# Patient Record
Sex: Female | Born: 1965 | Race: White | Hispanic: No | Marital: Married
Health system: Southern US, Community
[De-identification: ages and names within clinical notes are randomized; demographics above are authoritative.]

---

## 1998-12-04 ENCOUNTER — Encounter (INDEPENDENT_AMBULATORY_CARE_PROVIDER_SITE_OTHER): Payer: Self-pay | Admitting: Specialist

## 1998-12-04 ENCOUNTER — Other Ambulatory Visit: Admission: RE | Admit: 1998-12-04 | Discharge: 1998-12-04 | Payer: Self-pay | Admitting: Gastroenterology

## 1999-03-15 ENCOUNTER — Other Ambulatory Visit: Admission: RE | Admit: 1999-03-15 | Discharge: 1999-03-15 | Payer: Self-pay | Admitting: Obstetrics and Gynecology

## 2000-04-23 ENCOUNTER — Other Ambulatory Visit: Admission: RE | Admit: 2000-04-23 | Discharge: 2000-04-23 | Payer: Self-pay | Admitting: Obstetrics and Gynecology

## 2000-11-13 ENCOUNTER — Observation Stay (HOSPITAL_COMMUNITY): Admission: AD | Admit: 2000-11-13 | Discharge: 2000-11-14 | Payer: Self-pay | Admitting: Obstetrics and Gynecology

## 2000-11-16 ENCOUNTER — Inpatient Hospital Stay (HOSPITAL_COMMUNITY): Admission: AD | Admit: 2000-11-16 | Discharge: 2000-11-16 | Payer: Self-pay | Admitting: Obstetrics & Gynecology

## 2000-11-17 ENCOUNTER — Inpatient Hospital Stay (HOSPITAL_COMMUNITY): Admission: AD | Admit: 2000-11-17 | Discharge: 2000-11-20 | Payer: Self-pay | Admitting: Obstetrics and Gynecology

## 2000-11-19 ENCOUNTER — Encounter: Payer: Self-pay | Admitting: Obstetrics and Gynecology

## 2000-11-21 ENCOUNTER — Encounter: Admission: RE | Admit: 2000-11-21 | Discharge: 2000-12-21 | Payer: Self-pay | Admitting: Obstetrics and Gynecology

## 2000-12-25 ENCOUNTER — Other Ambulatory Visit: Admission: RE | Admit: 2000-12-25 | Discharge: 2000-12-25 | Payer: Self-pay | Admitting: Obstetrics and Gynecology

## 2001-01-21 ENCOUNTER — Encounter: Admission: RE | Admit: 2001-01-21 | Discharge: 2001-02-20 | Payer: Self-pay | Admitting: Obstetrics and Gynecology

## 2001-02-21 ENCOUNTER — Encounter: Admission: RE | Admit: 2001-02-21 | Discharge: 2001-03-23 | Payer: Self-pay | Admitting: Obstetrics and Gynecology

## 2001-04-23 ENCOUNTER — Encounter: Admission: RE | Admit: 2001-04-23 | Discharge: 2001-05-23 | Payer: Self-pay | Admitting: Obstetrics and Gynecology

## 2001-06-23 ENCOUNTER — Encounter: Admission: RE | Admit: 2001-06-23 | Discharge: 2001-07-23 | Payer: Self-pay | Admitting: Obstetrics and Gynecology

## 2001-11-25 ENCOUNTER — Encounter: Admission: RE | Admit: 2001-11-25 | Discharge: 2001-11-25 | Payer: Self-pay | Admitting: Obstetrics and Gynecology

## 2001-11-25 ENCOUNTER — Encounter: Payer: Self-pay | Admitting: Obstetrics and Gynecology

## 2002-05-25 ENCOUNTER — Other Ambulatory Visit: Admission: RE | Admit: 2002-05-25 | Discharge: 2002-05-25 | Payer: Self-pay | Admitting: Obstetrics and Gynecology

## 2002-06-25 ENCOUNTER — Other Ambulatory Visit: Admission: RE | Admit: 2002-06-25 | Discharge: 2002-06-25 | Payer: Self-pay | Admitting: Obstetrics and Gynecology

## 2003-08-17 ENCOUNTER — Other Ambulatory Visit: Admission: RE | Admit: 2003-08-17 | Discharge: 2003-08-17 | Payer: Self-pay | Admitting: Obstetrics and Gynecology

## 2004-10-19 ENCOUNTER — Encounter: Admission: RE | Admit: 2004-10-19 | Discharge: 2004-10-19 | Payer: Self-pay | Admitting: Obstetrics and Gynecology

## 2005-12-05 ENCOUNTER — Encounter: Admission: RE | Admit: 2005-12-05 | Discharge: 2005-12-05 | Payer: Self-pay | Admitting: Obstetrics and Gynecology

## 2006-05-20 ENCOUNTER — Emergency Department (HOSPITAL_COMMUNITY): Admission: EM | Admit: 2006-05-20 | Discharge: 2006-05-21 | Payer: Self-pay | Admitting: Emergency Medicine

## 2006-12-24 ENCOUNTER — Encounter: Admission: RE | Admit: 2006-12-24 | Discharge: 2006-12-24 | Payer: Self-pay | Admitting: Obstetrics and Gynecology

## 2007-12-28 ENCOUNTER — Encounter: Admission: RE | Admit: 2007-12-28 | Discharge: 2007-12-28 | Payer: Self-pay | Admitting: Obstetrics and Gynecology

## 2008-09-26 ENCOUNTER — Encounter (INDEPENDENT_AMBULATORY_CARE_PROVIDER_SITE_OTHER): Payer: Self-pay | Admitting: *Deleted

## 2008-12-30 ENCOUNTER — Encounter: Admission: RE | Admit: 2008-12-30 | Discharge: 2008-12-30 | Payer: Self-pay | Admitting: Obstetrics and Gynecology

## 2010-10-26 NOTE — H&P (Signed)
[facility]  Patient:    Judy Reyes, Judy Reyes                     MRN: [id number] Adm. Date:  [id number] Attending:  Lenoard Aden                         History and Physical  CHIEF COMPLAINT:              Rule out preeclampsia.  HISTORY OF PRESENT ILLNESS:   The patient is a 45 year old white female, G1 P0, EDD of December 03, 2000 at ______ 37 weeks, who presents with probable new onset preeclampsia.  PAST MEDICAL HISTORY:         Remarkable for HSV and vulvodynia, and recurrent yeast infections.  MEDICATIONS:                  Include Valtrex, prenatal vitamins and Labetalol.  SOCIAL HISTORY:               Otherwise noncontributory.  FAMILY HISTORY:               Hypertension, heart disease and thyroid dysfunction.  She has no known drug allergies.  PRENATAL LABORATORY DATA:     Reveal a blood type of O-positive, rH antibody negative, rubella nonimmune, hepatitis B surface antigen negative, and group B strep was negative.  Prenatal care was complicated by a probable chronic hypertension requiring medications in the third trimester.  Has been well-controlled on the Labetalol 100 mg b.i.d. with normal laboratory work until recently when there was a mild elevation of her SGOT and SGPT for which she was hospitalized four days ago with stabilization of her laboratory work and discharged to home.  In the office today, she reported an increased headache, epigastric pain and elevations of blood pressure.  PHYSICAL EXAMINATION:  VITAL SIGNS:                  Blood pressure 160/100.  HEENT:                        Normal.  LUNGS:                        Clear.  HEART:                        Regular rhythm.  ABDOMEN:                      Soft, gravid, nontender.  Estimated fetal weight 6.5 pounds.  CERVIX:                       Two cm, 50%, vertex, -2.  EXTREMITIES:                  ______ DTRs of 3+, no evidence of clonus.  NEUROLOGIC  EXAMINATION:       Nonfocal.  IMPRESSION:                   1. A 37-week intrauterine pregnancy.                               2. Chronic hypertension with superimposed  preeclampsia.                               3. History of HSV without lesions.  PLAN:                         Proceed with magnesium prophylaxis, Pitocin, anticipated attempts at vaginal delivery. DD:  11/17/00 TD:  11/17/00 Job: [id number]

## 2010-11-21 ENCOUNTER — Other Ambulatory Visit: Payer: Self-pay | Admitting: Obstetrics and Gynecology

## 2010-11-21 DIAGNOSIS — Z1231 Encounter for screening mammogram for malignant neoplasm of breast: Secondary | ICD-10-CM

## 2010-11-28 ENCOUNTER — Ambulatory Visit
Admission: RE | Admit: 2010-11-28 | Discharge: 2010-11-28 | Disposition: A | Discharge status: Home / Self Care | Payer: BC Managed Care – PPO | Source: Ambulatory Visit | Attending: Obstetrics and Gynecology | Admitting: Obstetrics and Gynecology

## 2010-11-28 DIAGNOSIS — Z1231 Encounter for screening mammogram for malignant neoplasm of breast: Secondary | ICD-10-CM

## 2011-01-24 ENCOUNTER — Other Ambulatory Visit: Payer: Self-pay | Admitting: Obstetrics and Gynecology

## 2011-01-24 DIAGNOSIS — Z780 Asymptomatic menopausal state: Secondary | ICD-10-CM

## 2011-01-24 DIAGNOSIS — M8580 Other specified disorders of bone density and structure, unspecified site: Secondary | ICD-10-CM

## 2011-08-26 ENCOUNTER — Other Ambulatory Visit: Payer: Self-pay | Admitting: Gastroenterology

## 2011-12-23 ENCOUNTER — Other Ambulatory Visit: Payer: Self-pay | Admitting: Obstetrics and Gynecology

## 2011-12-23 DIAGNOSIS — Z1231 Encounter for screening mammogram for malignant neoplasm of breast: Secondary | ICD-10-CM

## 2012-01-10 ENCOUNTER — Ambulatory Visit: Payer: BC Managed Care – PPO

## 2012-01-10 ENCOUNTER — Other Ambulatory Visit: Payer: BC Managed Care – PPO

## 2012-01-24 ENCOUNTER — Ambulatory Visit
Admission: RE | Admit: 2012-01-24 | Discharge: 2012-01-24 | Disposition: A | Discharge status: Home / Self Care | Payer: BC Managed Care – PPO | Source: Ambulatory Visit | Attending: Obstetrics and Gynecology | Admitting: Obstetrics and Gynecology

## 2012-01-24 DIAGNOSIS — Z780 Asymptomatic menopausal state: Secondary | ICD-10-CM

## 2012-01-24 DIAGNOSIS — Z1231 Encounter for screening mammogram for malignant neoplasm of breast: Secondary | ICD-10-CM

## 2013-01-18 ENCOUNTER — Other Ambulatory Visit: Payer: Self-pay

## 2013-01-18 DIAGNOSIS — Z1231 Encounter for screening mammogram for malignant neoplasm of breast: Secondary | ICD-10-CM

## 2013-01-29 ENCOUNTER — Ambulatory Visit
Admission: RE | Admit: 2013-01-29 | Discharge: 2013-01-29 | Disposition: A | Discharge status: Home / Self Care | Payer: BC Managed Care – PPO | Source: Ambulatory Visit

## 2013-01-29 DIAGNOSIS — Z1231 Encounter for screening mammogram for malignant neoplasm of breast: Secondary | ICD-10-CM

## 2014-03-09 ENCOUNTER — Encounter: Payer: Self-pay | Admitting: Gastroenterology

## 2014-04-19 ENCOUNTER — Ambulatory Visit (HOSPITAL_BASED_OUTPATIENT_CLINIC_OR_DEPARTMENT_OTHER): Payer: BC Managed Care – PPO | Attending: Internal Medicine | Admitting: Radiology

## 2014-04-19 VITALS — Ht 64.0 in | Wt 170.0 lb

## 2014-04-19 DIAGNOSIS — G4730 Sleep apnea, unspecified: Secondary | ICD-10-CM | POA: Diagnosis present

## 2014-04-19 DIAGNOSIS — G4733 Obstructive sleep apnea (adult) (pediatric): Secondary | ICD-10-CM | POA: Insufficient documentation

## 2014-04-23 DIAGNOSIS — G4733 Obstructive sleep apnea (adult) (pediatric): Secondary | ICD-10-CM

## 2014-04-23 NOTE — Sleep Study (Signed)
   NAME: Judy MerrittsMarina A Reyes DATE OF BIRTH:  [birth year] MEDICAL RECORD NUMBER [id number]  LOCATION: [facility]  PHYSICIAN: YOUNG,CLINTON D  DATE OF STUDY: 04/19/2014  SLEEP STUDY TYPE: Nocturnal Polysomnogram               REFERRING PHYSICIAN: Keturah Barrerossley, James J, MD  INDICATION FOR STUDY: hypersomnia with sleep apnea  EPWORTH SLEEPINESS SCORE:   11/24 HEIGHT: 5\' 4"  (162.6 cm)  WEIGHT: 170 lb (77.111 kg)    Body mass index is 29.17 kg/(m^2).  NECK SIZE: 12.5 in.  MEDICATIONS: Charted for review  SLEEP ARCHITECTURE: total sleep time 380.5 minutes with sleep efficiency 89.7%. Stage I was 9.1%, stage II 75%, stage III 7.5%, REM 8.4% of total sleep time. Sleep latency 4 minutes, REM latency 256.5 minutes, awake after sleep onset 39.5 minutes, arousal index 31.4, bedtime medication: None  RESPIRATORY DATA: apnea hypopnea index (AHI) 7.6 per hour. 48 total events scored including 13 obstructive apneas, 2 central apneas, 33 hypopneas. Most events were with supine sleep position. REM AHI 24.4 per hour. This was a diagnostic polysomnogram as ordered, without CPAP.  OXYGEN DATA: moderately loud snoring with oxygen desaturation to a nadir of 85% and mean saturation 95.6% on room air.  CARDIAC DATA: sinus rhythm with PVCs  MOVEMENT/PARASOMNIA: no significant movement disturbance, no bathroom trips  IMPRESSION/ RECOMMENDATION:   1) Mild obstructive sleep apnea/hypopnea syndrome, AHI 7.6 per hour with mostly supine events. REM AHI 24.4 per hour. Moderately loud snoring with oxygen desaturation to a nadir of 85% and mean saturation 95.6% on room air.   Waymon BudgeYOUNG,CLINTON D Diplomate, American Board of Sleep Medicine  ELECTRONICALLY SIGNED ON:  04/23/2014, 1:50 PM [facility] PH: [phone]   FX: [phone] ACCREDITED BY THE AMERICAN ACADEMY OF SLEEP MEDICINE

## 2014-07-08 ENCOUNTER — Other Ambulatory Visit: Payer: Self-pay

## 2014-07-08 ENCOUNTER — Ambulatory Visit
Admission: RE | Admit: 2014-07-08 | Discharge: 2014-07-08 | Disposition: A | Discharge status: Home / Self Care | Payer: BC Managed Care – PPO | Source: Ambulatory Visit

## 2014-07-08 DIAGNOSIS — Z1231 Encounter for screening mammogram for malignant neoplasm of breast: Secondary | ICD-10-CM

## 2014-10-19 ENCOUNTER — Encounter: Payer: Self-pay | Admitting: Gastroenterology

## 2016-01-03 ENCOUNTER — Other Ambulatory Visit: Payer: Self-pay | Admitting: Obstetrics and Gynecology

## 2016-01-03 DIAGNOSIS — Z1231 Encounter for screening mammogram for malignant neoplasm of breast: Secondary | ICD-10-CM

## 2016-01-05 ENCOUNTER — Ambulatory Visit
Admission: RE | Admit: 2016-01-05 | Discharge: 2016-01-05 | Disposition: A | Discharge status: Home / Self Care | Payer: BC Managed Care – PPO | Source: Ambulatory Visit | Attending: Obstetrics and Gynecology | Admitting: Obstetrics and Gynecology

## 2016-01-05 ENCOUNTER — Encounter: Payer: Self-pay | Admitting: Radiology

## 2016-01-05 DIAGNOSIS — Z1231 Encounter for screening mammogram for malignant neoplasm of breast: Secondary | ICD-10-CM

## 2017-07-11 DIAGNOSIS — M79641 Pain in right hand: Secondary | ICD-10-CM | POA: Insufficient documentation

## 2017-07-11 DIAGNOSIS — M6530 Trigger finger, unspecified finger: Secondary | ICD-10-CM | POA: Insufficient documentation

## 2017-10-03 DIAGNOSIS — K200 Eosinophilic esophagitis: Secondary | ICD-10-CM | POA: Insufficient documentation

## 2017-11-28 DIAGNOSIS — M25552 Pain in left hip: Secondary | ICD-10-CM | POA: Insufficient documentation

## 2017-12-24 ENCOUNTER — Other Ambulatory Visit: Payer: Self-pay | Admitting: Obstetrics and Gynecology

## 2017-12-24 DIAGNOSIS — Z780 Asymptomatic menopausal state: Secondary | ICD-10-CM

## 2017-12-24 DIAGNOSIS — M8580 Other specified disorders of bone density and structure, unspecified site: Secondary | ICD-10-CM

## 2018-01-28 ENCOUNTER — Ambulatory Visit
Admission: RE | Admit: 2018-01-28 | Discharge: 2018-01-28 | Disposition: A | Discharge status: Home / Self Care | Payer: BC Managed Care – PPO | Source: Ambulatory Visit | Attending: Obstetrics and Gynecology | Admitting: Obstetrics and Gynecology

## 2018-01-28 DIAGNOSIS — Z780 Asymptomatic menopausal state: Secondary | ICD-10-CM

## 2018-01-28 DIAGNOSIS — M8580 Other specified disorders of bone density and structure, unspecified site: Secondary | ICD-10-CM

## 2018-02-23 ENCOUNTER — Other Ambulatory Visit: Payer: Self-pay | Admitting: Physician Assistant

## 2018-02-23 DIAGNOSIS — R197 Diarrhea, unspecified: Secondary | ICD-10-CM

## 2018-02-23 DIAGNOSIS — R109 Unspecified abdominal pain: Secondary | ICD-10-CM

## 2018-03-02 ENCOUNTER — Ambulatory Visit
Admission: RE | Admit: 2018-03-02 | Discharge: 2018-03-02 | Disposition: A | Discharge status: Home / Self Care | Payer: BC Managed Care – PPO | Source: Ambulatory Visit | Attending: Physician Assistant | Admitting: Physician Assistant

## 2018-03-02 DIAGNOSIS — R197 Diarrhea, unspecified: Secondary | ICD-10-CM

## 2018-03-02 DIAGNOSIS — R109 Unspecified abdominal pain: Secondary | ICD-10-CM

## 2018-03-02 MED ORDER — IOPAMIDOL (ISOVUE-300) INJECTION 61%
100.0000 mL | Freq: Once | INTRAVENOUS | Status: AC | PRN
  Administered 2018-03-02: 100 mL via INTRAVENOUS

## 2018-03-06 ENCOUNTER — Other Ambulatory Visit: Payer: Self-pay | Admitting: Physician Assistant

## 2018-03-06 DIAGNOSIS — R9389 Abnormal findings on diagnostic imaging of other specified body structures: Secondary | ICD-10-CM

## 2018-03-07 ENCOUNTER — Ambulatory Visit
Admission: RE | Admit: 2018-03-07 | Discharge: 2018-03-07 | Disposition: A | Discharge status: Home / Self Care | Payer: BC Managed Care – PPO | Source: Ambulatory Visit | Attending: Physician Assistant | Admitting: Physician Assistant

## 2018-03-07 DIAGNOSIS — R9389 Abnormal findings on diagnostic imaging of other specified body structures: Secondary | ICD-10-CM

## 2018-03-07 MED ORDER — GADOBENATE DIMEGLUMINE 529 MG/ML IV SOLN
17.0000 mL | Freq: Once | INTRAVENOUS | Status: AC | PRN
  Administered 2018-03-07: 17 mL via INTRAVENOUS

## 2018-03-14 ENCOUNTER — Other Ambulatory Visit: Payer: BC Managed Care – PPO

## 2019-03-10 ENCOUNTER — Other Ambulatory Visit: Payer: Self-pay

## 2019-03-10 ENCOUNTER — Encounter: Payer: Self-pay | Admitting: Podiatry

## 2019-03-10 ENCOUNTER — Ambulatory Visit (INDEPENDENT_AMBULATORY_CARE_PROVIDER_SITE_OTHER): Payer: BC Managed Care – PPO

## 2019-03-10 ENCOUNTER — Other Ambulatory Visit: Payer: Self-pay | Admitting: Podiatry

## 2019-03-10 ENCOUNTER — Ambulatory Visit: Payer: BC Managed Care – PPO | Admitting: Podiatry

## 2019-03-10 DIAGNOSIS — Z8719 Personal history of other diseases of the digestive system: Secondary | ICD-10-CM | POA: Insufficient documentation

## 2019-03-10 DIAGNOSIS — B351 Tinea unguium: Secondary | ICD-10-CM

## 2019-03-10 DIAGNOSIS — M79672 Pain in left foot: Secondary | ICD-10-CM

## 2019-03-10 DIAGNOSIS — M7751 Other enthesopathy of right foot: Secondary | ICD-10-CM

## 2019-03-10 DIAGNOSIS — M79671 Pain in right foot: Secondary | ICD-10-CM | POA: Diagnosis not present

## 2019-03-10 DIAGNOSIS — M7752 Other enthesopathy of left foot: Secondary | ICD-10-CM

## 2019-03-10 NOTE — Progress Notes (Signed)
   HPI: 53 y.o. female presenting today as a new patient for evaluation of bilateral great toe pain.  Left greater than the right.  This is been ongoing for several months.  She was seen by Dr. Gershon Mussel, podiatry, and referred here for bone spurs to the distal phalanx of the bilateral great toes.  She presents today for further treatment evaluation  No past medical history on file.   Physical Exam: General: The patient is alert and oriented x3 in no acute distress.  Dermatology: Skin is warm, dry and supple bilateral lower extremities. Negative for open lesions or macerations.  Hypertrophic dystrophic nails noted to the bilateral great toes consistent with onychomycosis  Vascular: Palpable pedal pulses bilaterally. No edema or erythema noted. Capillary refill within normal limits.  Neurological: Epicritic and protective threshold grossly intact bilaterally.   Musculoskeletal Exam: Range of motion within normal limits to all pedal and ankle joints bilateral. Muscle strength 5/5 in all groups bilateral.  There is some pain with compression and palpation of the nail plate to the left great toe  Radiographic Exam:  Normal osseous mineralization. Joint spaces preserved. No fracture/dislocation/boney destruction.  There are some very subtle spur formations underlying the subungual nail plate.  I do not believe that they are contributory to the symptomatic thickened nails.  Assessment: 1.  Onychomycosis bilateral great toenails   Plan of Care:  1. Patient evaluated. X-Rays reviewed.  2.  Continue taking oral Lamisil 250 mg #90 as prescribed by Dr. Gershon Mussel, podiatry 3.  I explained to the patient that I do not believe we should rush to surgery and remove the bone spurs.  Recommend simply treating the fungal nails first to see if it alleviates symptoms 4.  Recommend OTC urea topical gel nail softener as the nails grow out  5.  Return to clinic as needed      Edrick Kins, DPM [facility]  Dr. Edrick Kins, DPM    [address], [place] [address]                Office [phone]  Fax [phone]

## 2019-03-10 NOTE — Progress Notes (Signed)
DG  °

## 2019-03-10 NOTE — Patient Instructions (Signed)
Urea topical gel (at least 40%) on [email or web address]

## 2019-08-25 ENCOUNTER — Ambulatory Visit: Payer: BC Managed Care – PPO

## 2020-04-20 ENCOUNTER — Other Ambulatory Visit: Payer: Self-pay | Admitting: Obstetrics and Gynecology

## 2020-04-20 DIAGNOSIS — M810 Age-related osteoporosis without current pathological fracture: Secondary | ICD-10-CM

## 2020-07-31 ENCOUNTER — Other Ambulatory Visit: Payer: BC Managed Care – PPO

## 2021-01-11 ENCOUNTER — Other Ambulatory Visit: Payer: Self-pay | Admitting: Obstetrics and Gynecology

## 2021-01-11 DIAGNOSIS — M810 Age-related osteoporosis without current pathological fracture: Secondary | ICD-10-CM

## 2021-02-09 ENCOUNTER — Other Ambulatory Visit: Payer: Self-pay

## 2021-04-09 ENCOUNTER — Other Ambulatory Visit: Payer: Self-pay | Admitting: Obstetrics and Gynecology

## 2021-04-09 ENCOUNTER — Other Ambulatory Visit: Payer: Self-pay | Admitting: Family Medicine

## 2021-04-09 DIAGNOSIS — Z1231 Encounter for screening mammogram for malignant neoplasm of breast: Secondary | ICD-10-CM

## 2021-05-29 ENCOUNTER — Ambulatory Visit
Admission: RE | Admit: 2021-05-29 | Discharge: 2021-05-29 | Disposition: A | Discharge status: Home / Self Care | Payer: BC Managed Care – PPO | Source: Ambulatory Visit | Attending: Obstetrics and Gynecology | Admitting: Obstetrics and Gynecology

## 2021-05-29 DIAGNOSIS — M810 Age-related osteoporosis without current pathological fracture: Secondary | ICD-10-CM

## 2021-05-29 DIAGNOSIS — Z1231 Encounter for screening mammogram for malignant neoplasm of breast: Secondary | ICD-10-CM

## 2021-12-25 ENCOUNTER — Other Ambulatory Visit: Payer: Self-pay | Admitting: Obstetrics and Gynecology

## 2023-02-21 IMAGING — MG MM DIGITAL SCREENING BILAT W/ TOMO AND CAD
8 series · 9 of 24 positions shown · non-contrast
Comparison: Previous exam(s).

CLINICAL DATA: Screening.

EXAM:
DIGITAL SCREENING BILATERAL MAMMOGRAM WITH TOMOSYNTHESIS AND CAD
TECHNIQUE: Bilateral screening digital craniocaudal and mediolateral oblique
mammograms were obtained. Bilateral screening digital breast
tomosynthesis was performed. The images were evaluated with
computer-aided detection.

[L CC synth-2D]
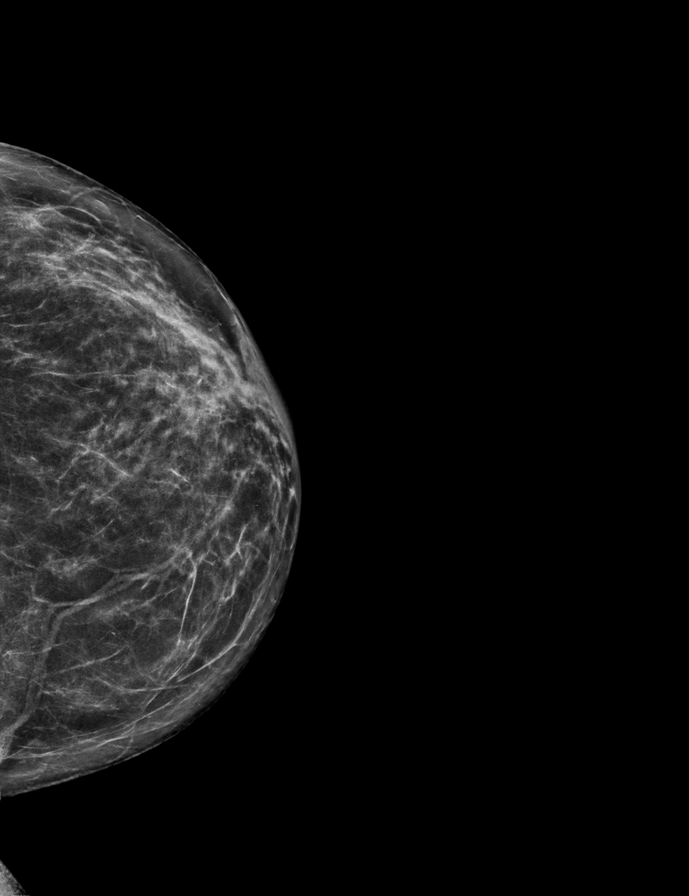

[L MLO synth-2D]
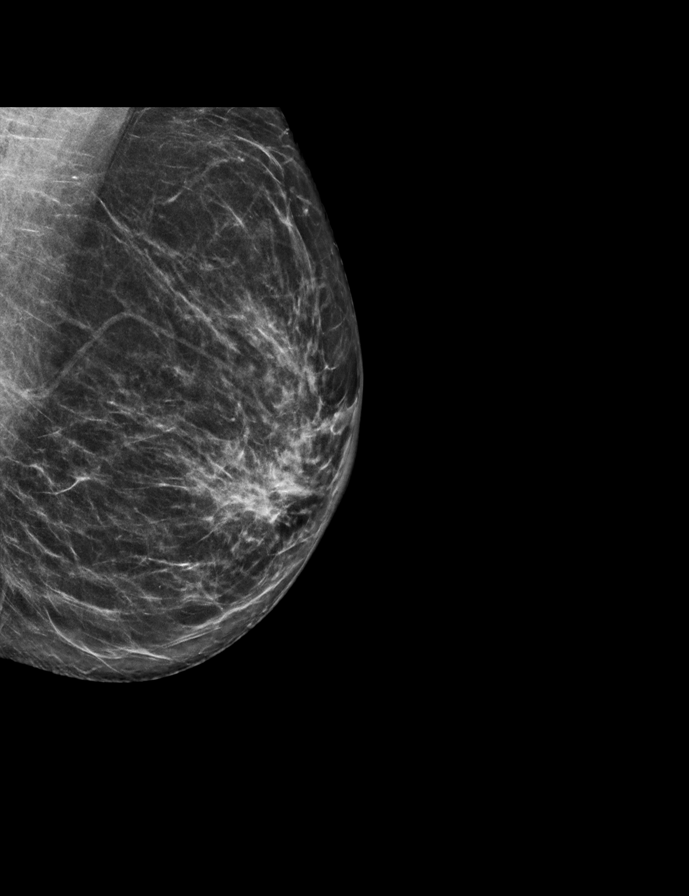

[R MLO synth-2D]
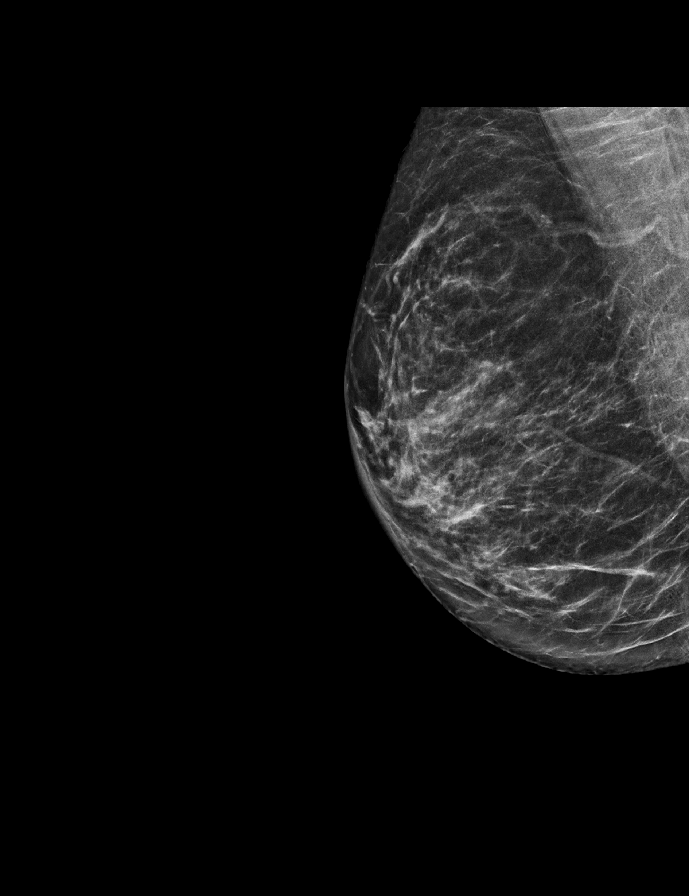

[R CC synth-2D]
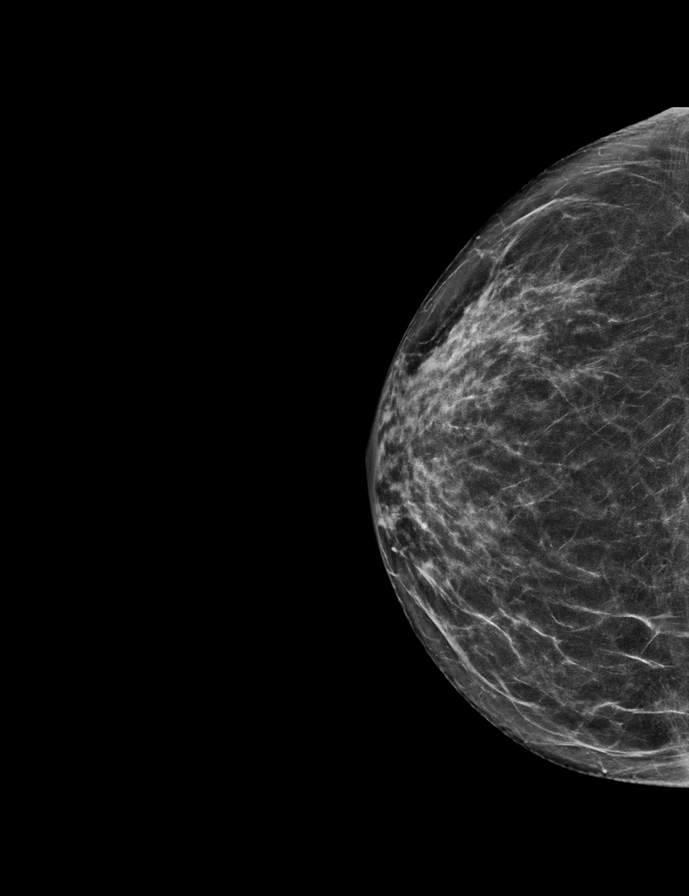

[L CC tomo · 2 of 60 frames shown]
[frame 20/60]
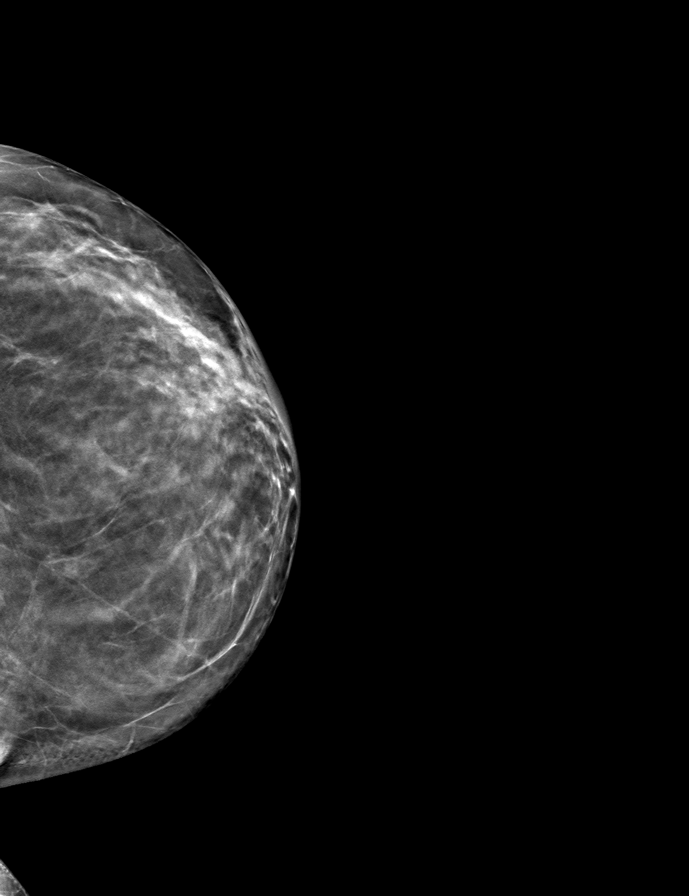
[frame 31/60]
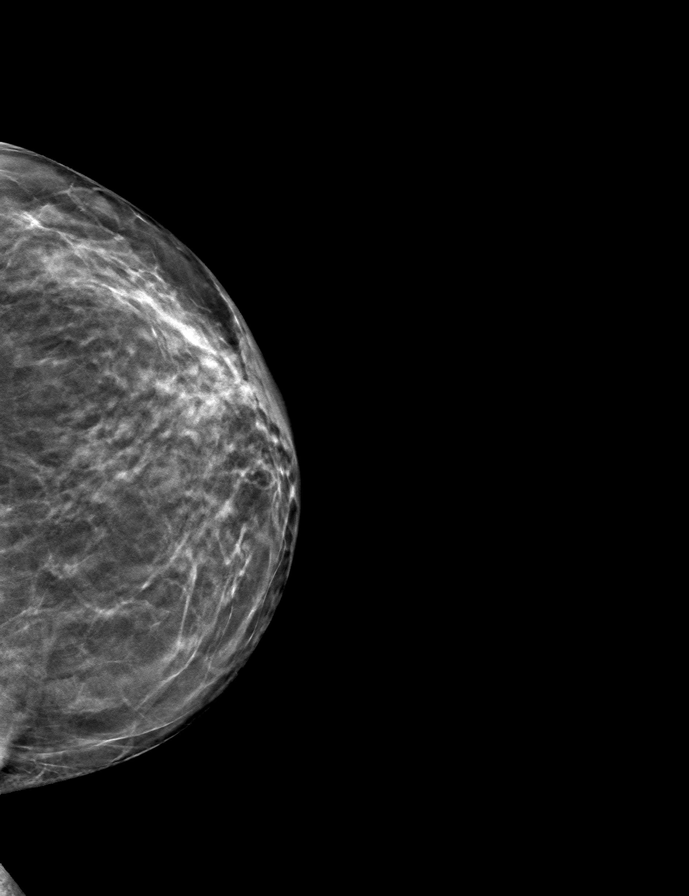

[R MLO tomo · tomo slice 31/62.0]
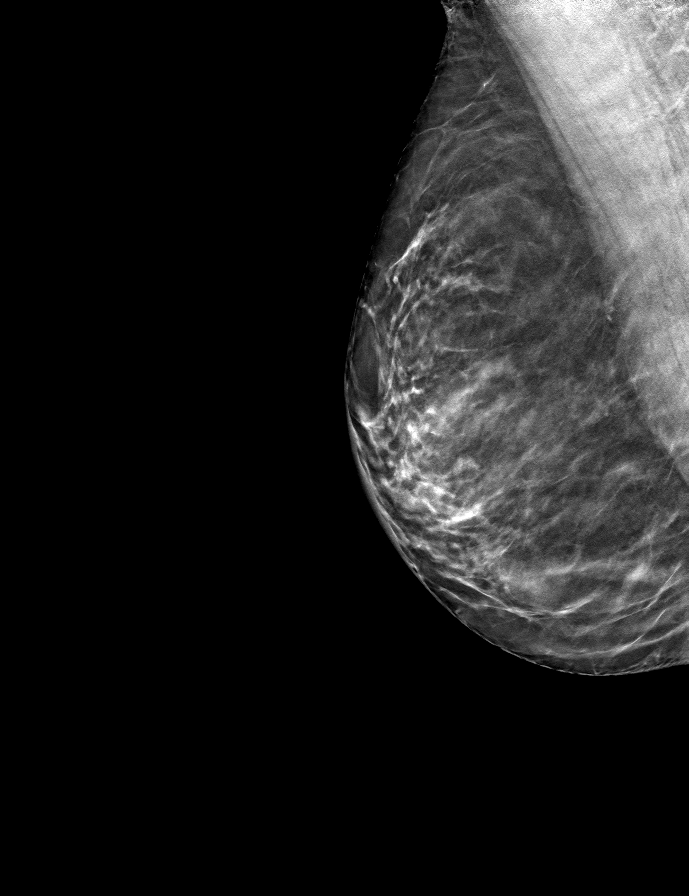

[R CC tomo · tomo slice 29/58.0]
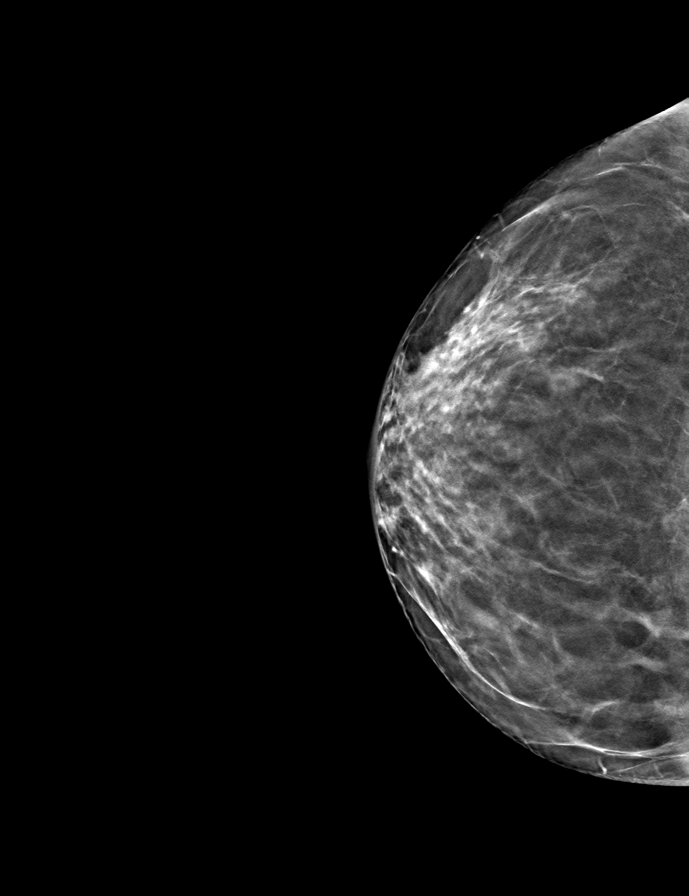

[L MLO tomo · tomo slice 32/63.0]
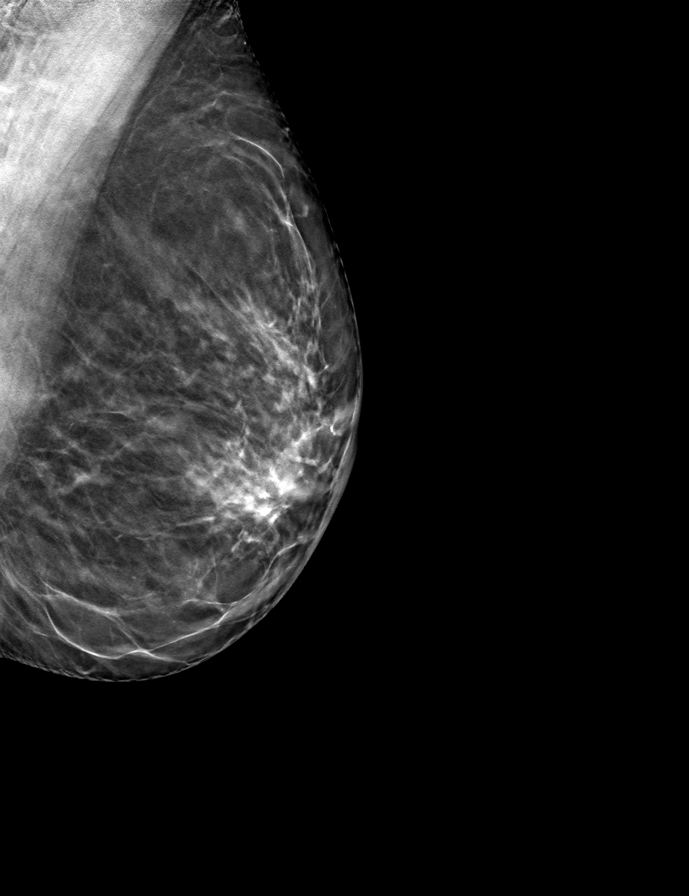

[9 of 24 positions shown; findings below may reference images not displayed]

ACR Breast Density Category c: The breast tissue is heterogeneously
dense, which may obscure small masses.
FINDINGS: There are no findings suspicious for malignancy.
IMPRESSION: No mammographic evidence of malignancy. A result letter of this
screening mammogram will be mailed directly to the patient.

RECOMMENDATION:
Screening mammogram in one year. (Code:Q3-W-BC3)

BI-RADS CATEGORY  1: Negative.
